# Patient Record
Sex: Female | Born: 2000 | Race: White | Hispanic: No | Marital: Single | Smoking: Never smoker
Health system: Southern US, Community
[De-identification: ages and names within clinical notes are randomized; demographics above are authoritative.]

---
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
## 2020-03-28 IMAGING — DX CHEST - 2 VIEW
2 series · 2 of 2 positions shown · non-contrast
Comparison: None.

CLINICAL DATA: Left-sided chest pain for 2 weeks.  Lightheadedness.

EXAM:
CHEST - 2 VIEW

[chest pa]
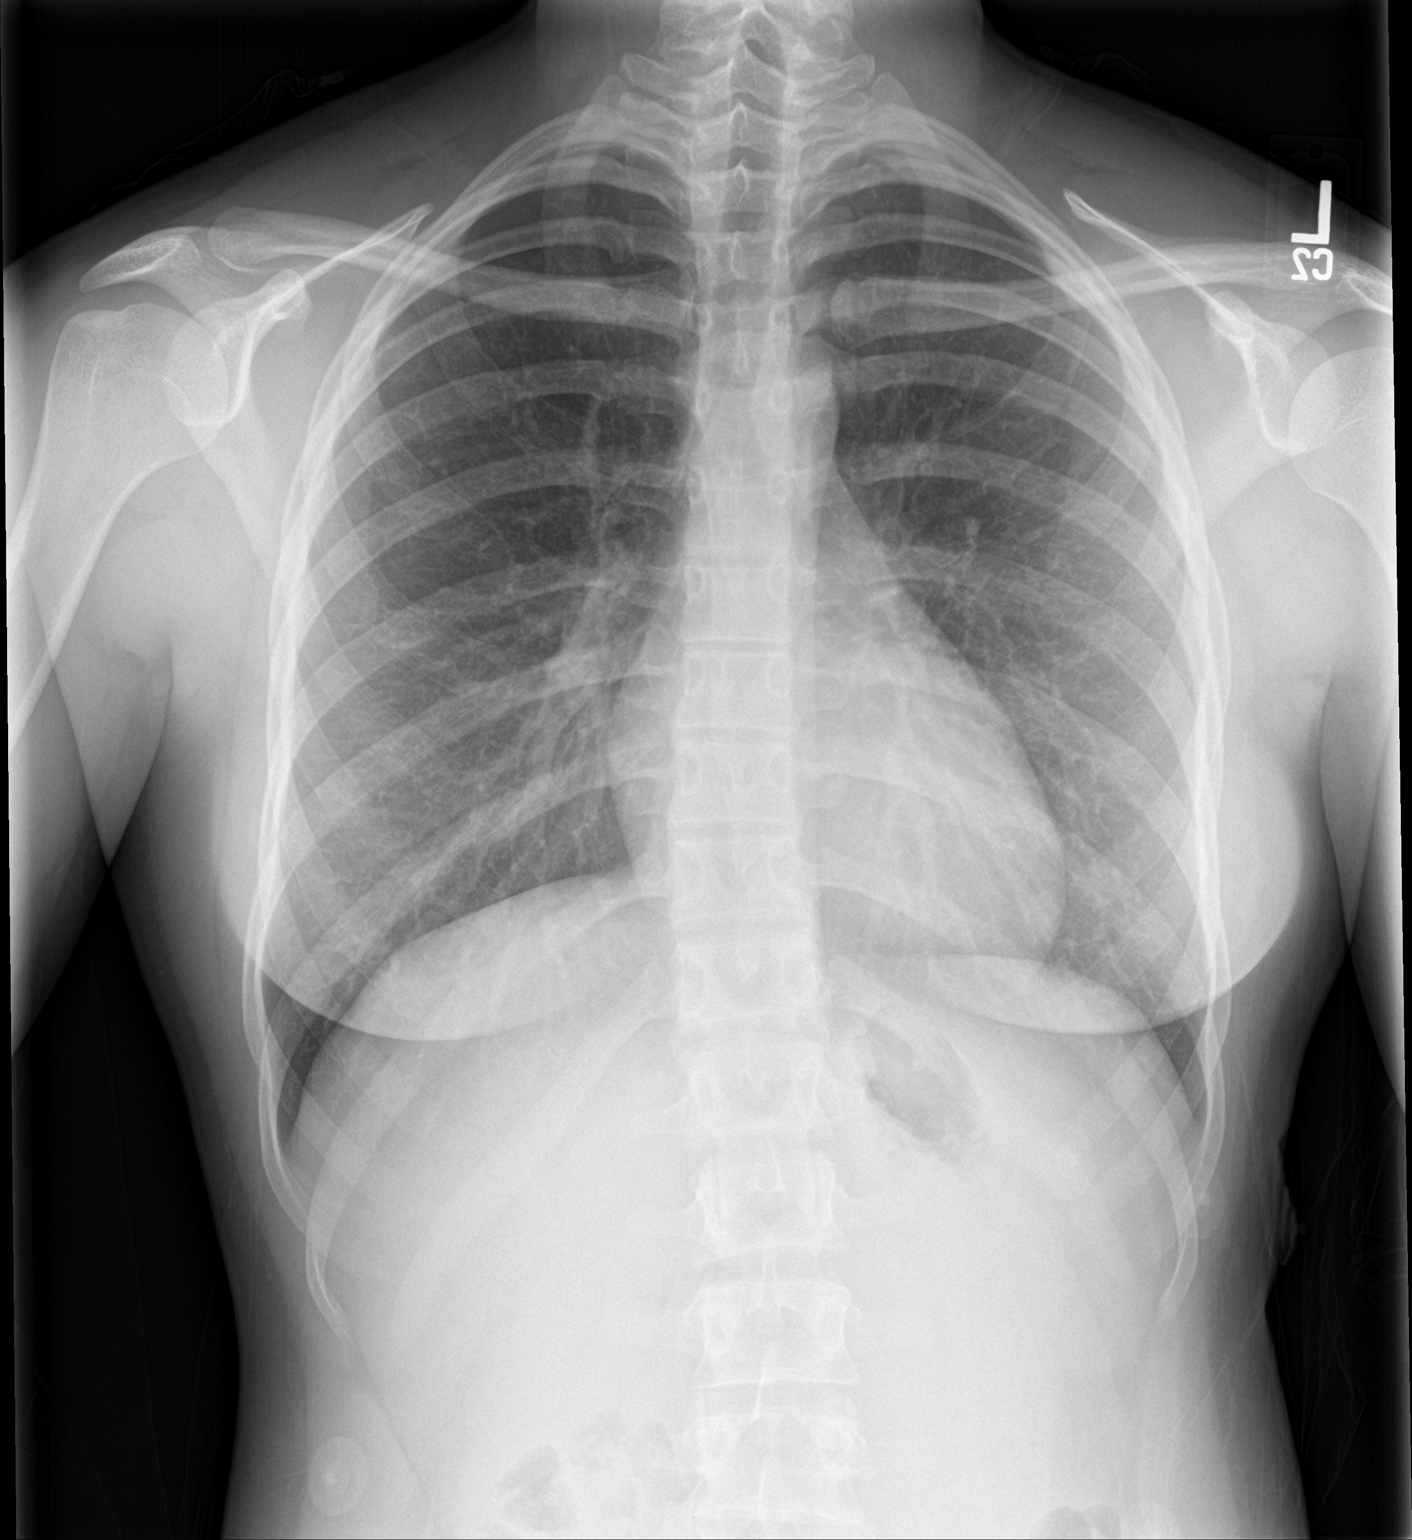

[chest lat]
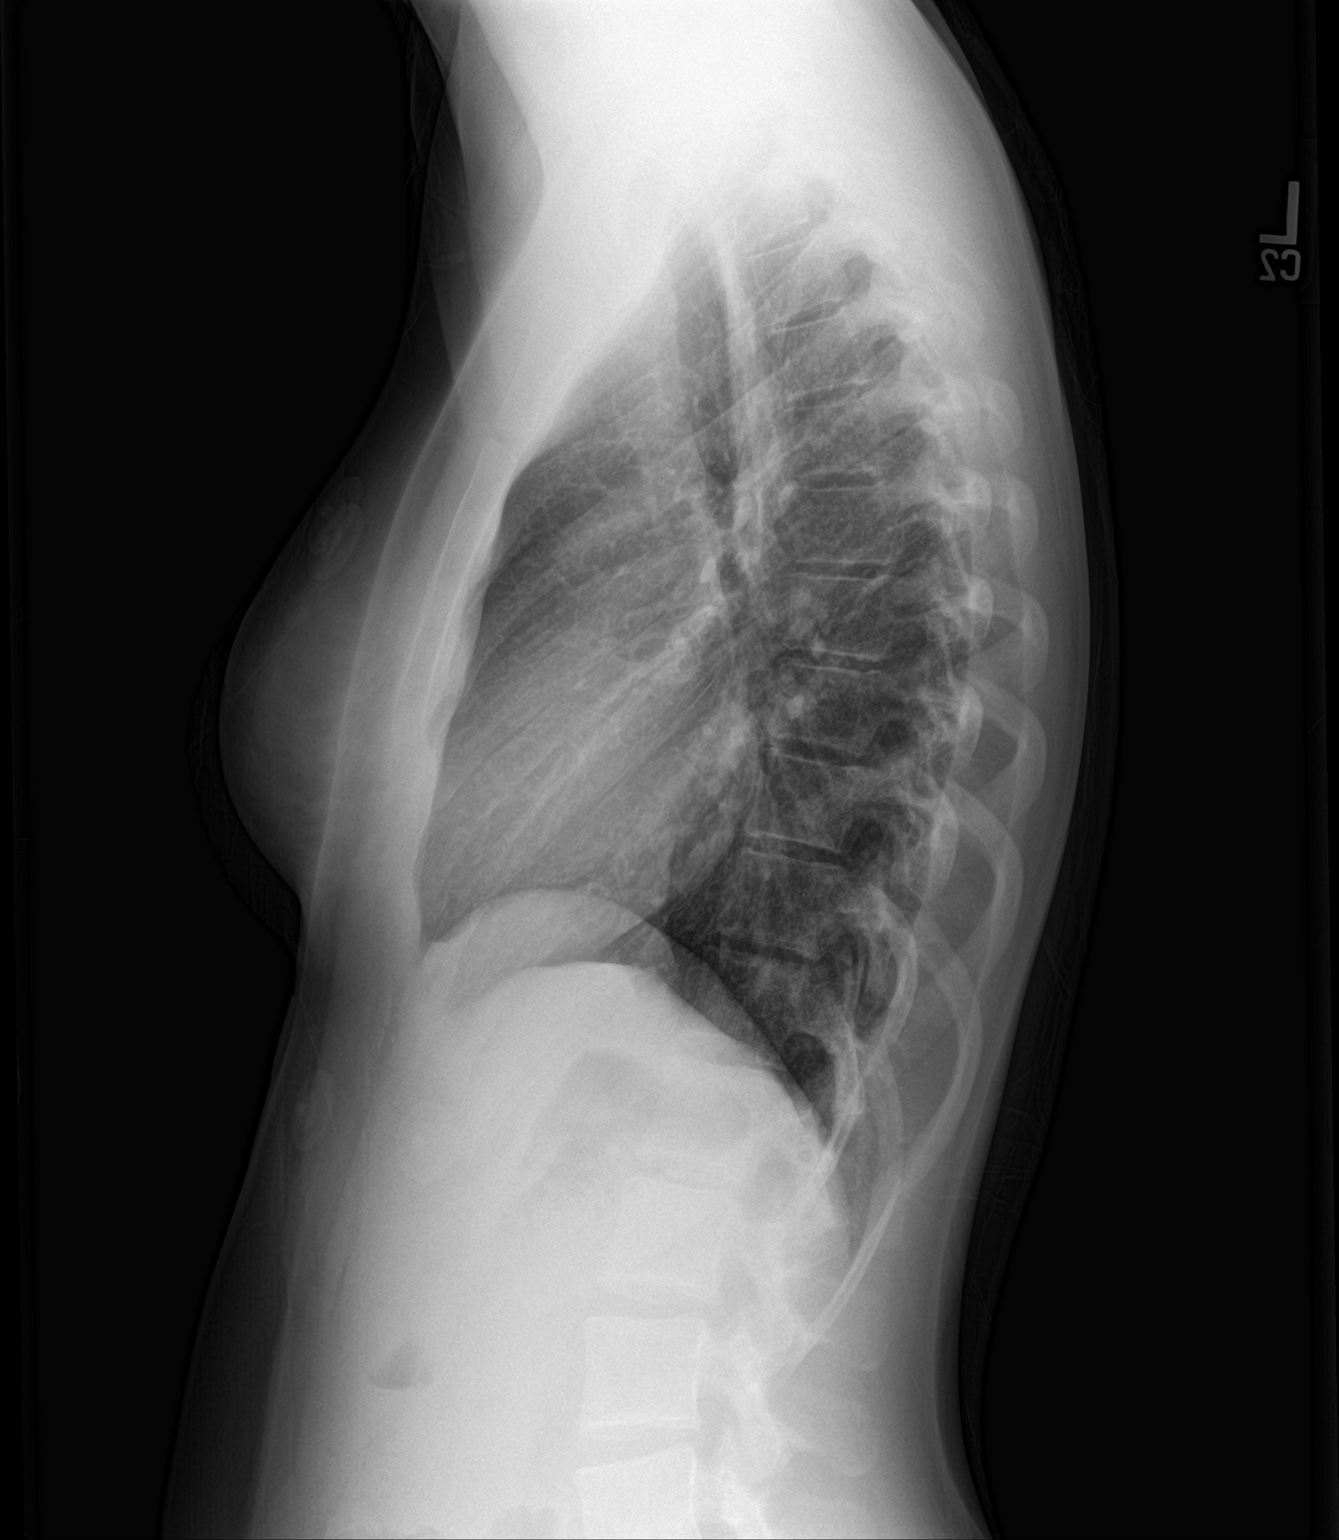

[2 of 2 positions shown; findings below may reference images not displayed]

FINDINGS: The heart size and mediastinal contours are within normal limits.
Both lungs are clear. The visualized skeletal structures are
unremarkable.
IMPRESSION: Normal study.  No active cardiopulmonary disease.
